# Patient Record
Sex: Male | Born: 2000 | Race: White | Hispanic: No | Marital: Single | Smoking: Never smoker
Health system: Southern US, Community
[De-identification: ages and names within clinical notes are randomized; demographics above are authoritative.]

## PROBLEM LIST (undated history)

## (undated) ENCOUNTER — Emergency Department (HOSPITAL_COMMUNITY): Disposition: A | Discharge status: Home / Self Care | Payer: Medicaid Other

## (undated) DIAGNOSIS — Z8659 Personal history of other mental and behavioral disorders: Secondary | ICD-10-CM

## (undated) DIAGNOSIS — T7840XA Allergy, unspecified, initial encounter: Secondary | ICD-10-CM

## (undated) DIAGNOSIS — Z8669 Personal history of other diseases of the nervous system and sense organs: Secondary | ICD-10-CM

## (undated) HISTORY — DX: Allergy, unspecified, initial encounter: T78.40XA

## (undated) HISTORY — DX: Personal history of other diseases of the nervous system and sense organs: Z86.69

## (undated) HISTORY — PX: CIRCUMCISION: SUR203

## (undated) HISTORY — DX: Personal history of other mental and behavioral disorders: Z86.59

---
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
## 2020-02-24 ENCOUNTER — Other Ambulatory Visit: Payer: Self-pay

## 2020-02-24 ENCOUNTER — Other Ambulatory Visit: Payer: Medicaid Other

## 2020-02-24 DIAGNOSIS — Z20822 Contact with and (suspected) exposure to covid-19: Secondary | ICD-10-CM | POA: Diagnosis not present

## 2020-02-26 LAB — NOVEL CORONAVIRUS, NAA: SARS-CoV-2, NAA: DETECTED — AB

## 2020-02-26 LAB — SPECIMEN STATUS REPORT

## 2020-02-26 LAB — SARS-COV-2, NAA 2 DAY TAT

## 2020-02-28 ENCOUNTER — Other Ambulatory Visit: Payer: Medicaid Other

## 2020-03-01 ENCOUNTER — Ambulatory Visit: Payer: Self-pay | Admitting: *Deleted

## 2020-03-01 NOTE — Telephone Encounter (Signed)
  I returned pt's call.   He was diagnosed with COVID-19  Last Monday.   "All my symptoms have gone away except this cough".   He was coughing a lot while I was talking with him.   It's a dry cough.  No other symptoms. He has tried 3 different over the counter medications but "none of them help".  I let him know it sounded like he needs a prescription cough medicine.  I asked him if he had a primary care physician he replied,   "I don't know".   "My mother takes care of that and makes those calls for me".   "She's sleeping now is why I called you".    "When she wakes up I'll have her call and see if she can get me an appt".  He thanked me for my help.   Reason for Disposition . [1] Continuous (nonstop) coughing interferes with work or school AND [2] no improvement using cough treatment per protocol  Answer Assessment - Initial Assessment Questions 1. ONSET: "When did the cough begin?"      Since COVID on Monday.    2. SEVERITY: "How bad is the cough today?"      I'm coughing a lot. 3. SPUTUM: "Describe the color of your sputum" (none, dry cough; clear, white, yellow, green)     Dry cough 4. HEMOPTYSIS: "Are you coughing up any blood?" If so ask: "How much?" (flecks, streaks, tablespoons, etc.)     No 5. DIFFICULTY BREATHING: "Are you having difficulty breathing?" If Yes, ask: "How bad is it?" (e.g., mild, moderate, severe)    - MILD: No SOB at rest, mild SOB with walking, speaks normally in sentences, can lay down, no retractions, pulse < 100.    - MODERATE: SOB at rest, SOB with minimal exertion and prefers to sit, cannot lie down flat, speaks in phrases, mild retractions, audible wheezing, pulse 100-120.    - SEVERE: Very SOB at rest, speaks in single words, struggling to breathe, sitting hunched forward, retractions, pulse > 120      No 6. FEVER: "Do you have a fever?" If Yes, ask: "What is your temperature, how was it measured, and when did it start?"     No 7. CARDIAC HISTORY: "Do  you have any history of heart disease?" (e.g., heart attack, congestive heart failure)      *No Answer* 8. LUNG HISTORY: "Do you have any history of lung disease?"  (e.g., pulmonary embolus, asthma, emphysema)     *No Answer* 9. PE RISK FACTORS: "Do you have a history of blood clots?" (or: recent major surgery, recent prolonged travel, bedridden)     *No Answer* 10. OTHER SYMPTOMS: "Do you have any other symptoms?" (e.g., runny nose, wheezing, chest pain)       No.  "the symptoms I had initially when I was diagnosed have all gone away except this cough" 11. PREGNANCY: "Is there any chance you are pregnant?" "When was your last menstrual period?"       N/A 12. TRAVEL: "Have you traveled out of the country in the last month?" (e.g., travel history, exposures)       Positive COVID-19  Protocols used: COUGH - ACUTE NON-PRODUCTIVE-A-AH

## 2020-07-21 IMAGING — DX DG HAND COMPLETE 3+V*R*
3 series · 3 of 3 positions shown · non-contrast
Comparison: None.

CLINICAL DATA: Punched a wall 3 days ago with persistent hand pain
medially, initial encounter

EXAM:
RIGHT HAND - COMPLETE 3+ VIEW

[hand pa]
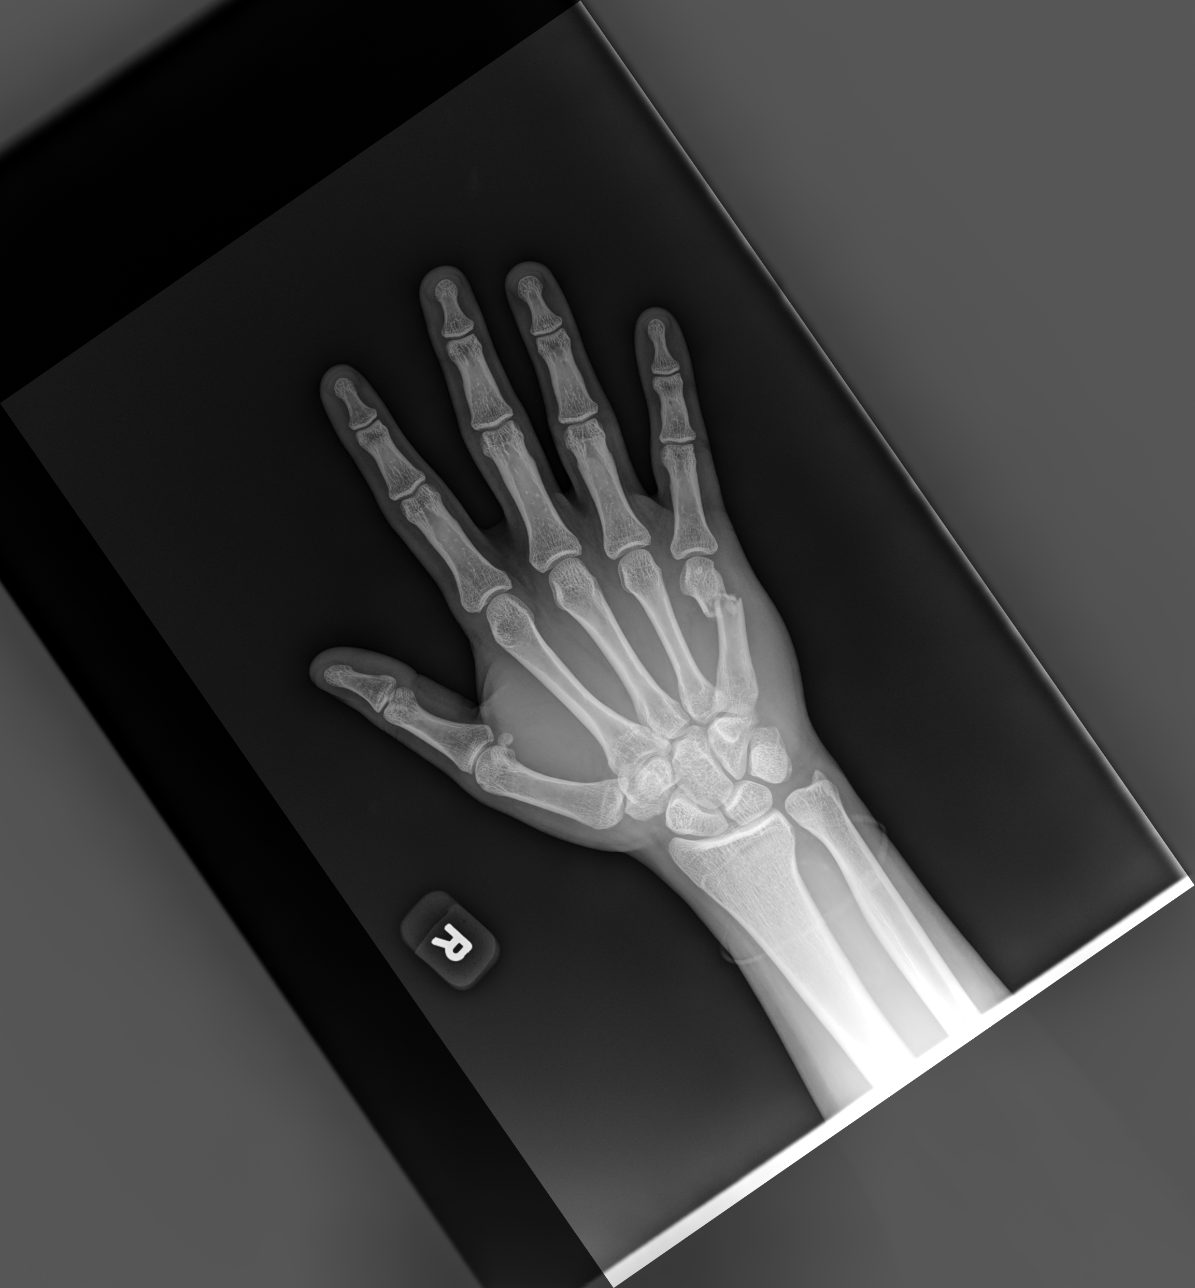

[hand mlo]
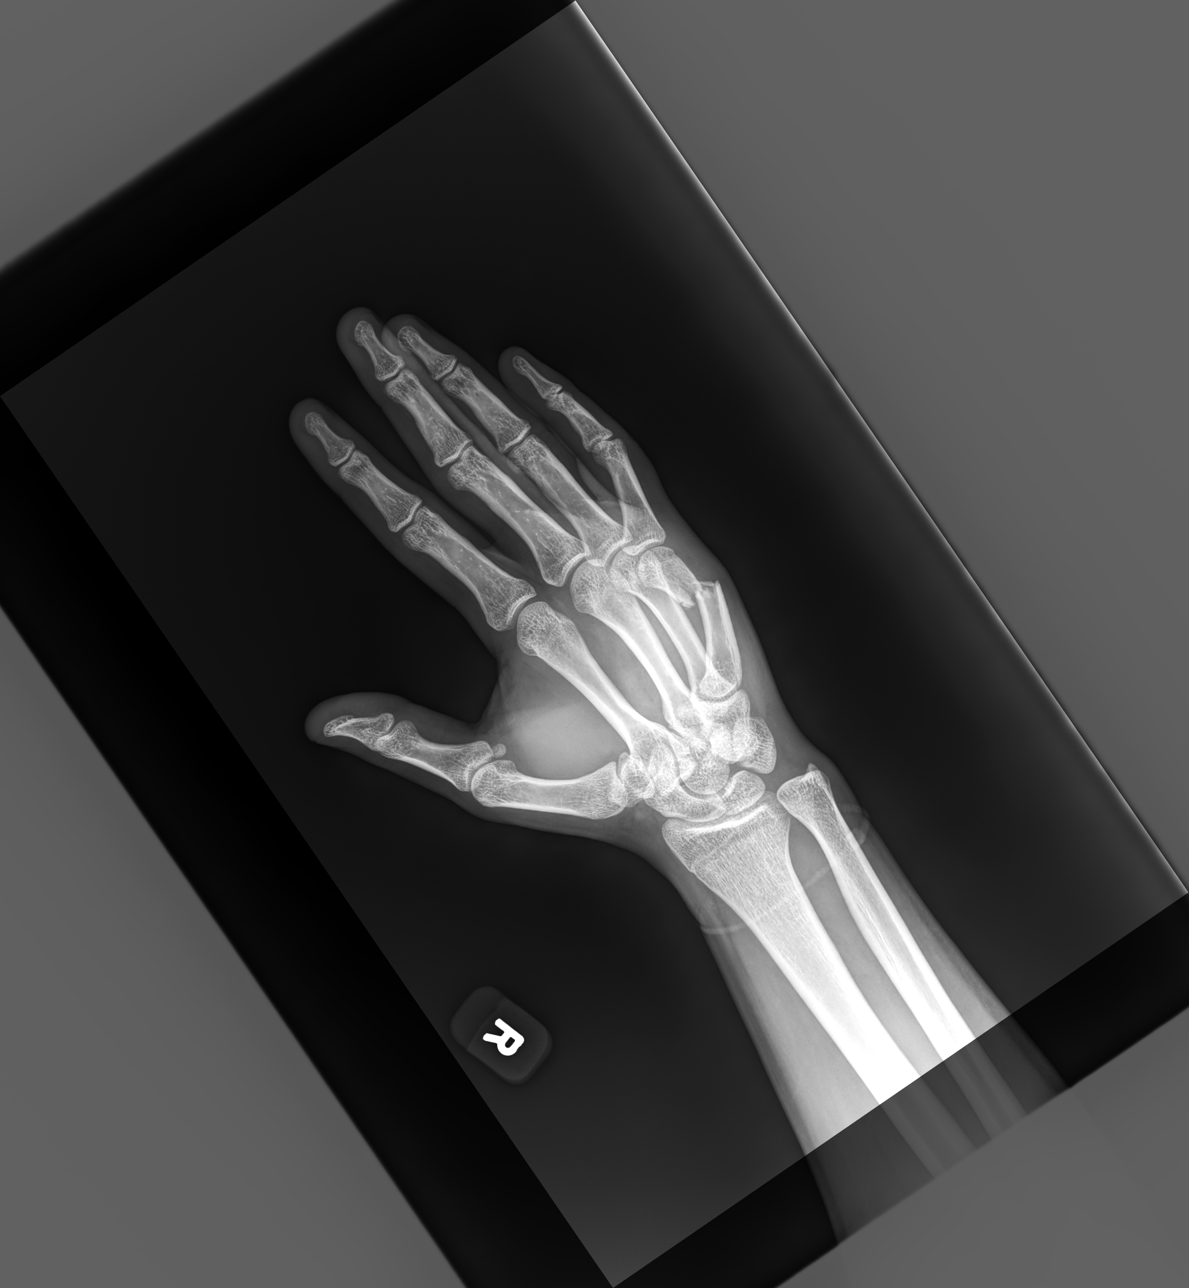

[hand lat]
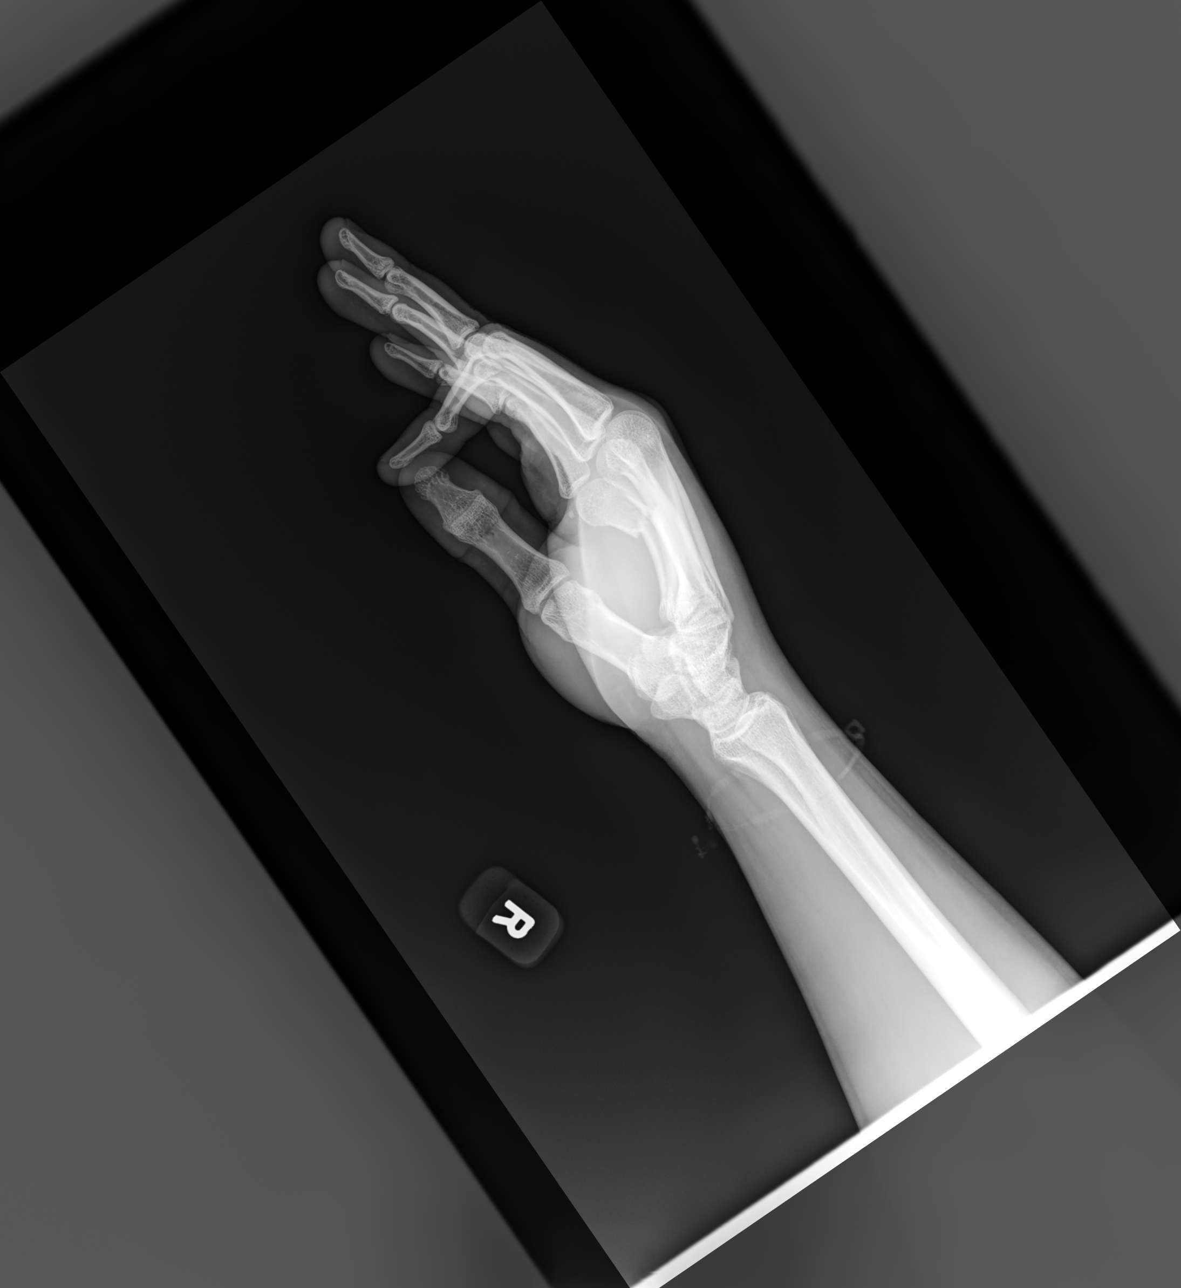

[3 of 3 positions shown; findings below may reference images not displayed]

FINDINGS: Comminuted fifth metacarpal fracture is noted with impaction and [DATE]
bone width displacement and angulation at the fracture site. No
other fracture is seen. Soft tissue swelling is noted.
IMPRESSION: Comminuted fifth metacarpal fracture as described.

## 2020-10-04 ENCOUNTER — Encounter: Payer: Self-pay | Admitting: Pediatrics

## 2022-10-23 DIAGNOSIS — J208 Acute bronchitis due to other specified organisms: Secondary | ICD-10-CM | POA: Diagnosis not present

## 2022-10-23 DIAGNOSIS — R042 Hemoptysis: Secondary | ICD-10-CM | POA: Diagnosis not present

## 2022-10-23 DIAGNOSIS — Z8709 Personal history of other diseases of the respiratory system: Secondary | ICD-10-CM | POA: Diagnosis not present

## 2022-10-23 DIAGNOSIS — B9689 Other specified bacterial agents as the cause of diseases classified elsewhere: Secondary | ICD-10-CM | POA: Diagnosis not present

## 2023-09-04 DIAGNOSIS — M25512 Pain in left shoulder: Secondary | ICD-10-CM | POA: Diagnosis not present

## 2024-04-19 ENCOUNTER — Telehealth: Admitting: Family Medicine

## 2024-04-19 DIAGNOSIS — B9689 Other specified bacterial agents as the cause of diseases classified elsewhere: Secondary | ICD-10-CM | POA: Diagnosis not present

## 2024-04-19 DIAGNOSIS — J019 Acute sinusitis, unspecified: Secondary | ICD-10-CM | POA: Diagnosis not present

## 2024-04-19 MED ORDER — DOXYCYCLINE HYCLATE 100 MG PO TABS: 100.0000 mg | ORAL_TABLET | Freq: Two times a day (BID) | ORAL | 0 refills | Status: AC

## 2024-04-19 NOTE — Progress Notes (Signed)
 E-Visit for Sinus Problems  We are sorry that you are not feeling well.  Here is how we plan to help!  Based on what you have shared with me it looks like you have sinusitis.  Sinusitis is inflammation and infection in the sinus cavities of the head.  Based on your presentation I believe you most likely have Acute Bacterial Sinusitis.  This is an infection caused by bacteria and is treated with antibiotics. I have prescribed Doxycycline  100mg  by mouth twice a day for 7 days. You may use an oral decongestant such as Mucinex  D or if you have glaucoma or high blood pressure use plain Mucinex . Saline nasal spray help and can safely be used as often as needed for congestion.  If you develop worsening sinus pain, fever or notice severe headache and vision changes, or if symptoms are not better after completion of antibiotic, please schedule an appointment with a health care provider.    Sinus infections are not as easily transmitted as other respiratory infection, however we still recommend that you avoid close contact with loved ones, especially the very young and elderly.  Remember to wash your hands thoroughly throughout the day as this is the number one way to prevent the spread of infection!  Home Care: Only take medications as instructed by your medical team. Complete the entire course of an antibiotic. Do not take these medications with alcohol. A steam or ultrasonic humidifier can help congestion.  You can place a towel over your head and breathe in the steam from hot water  coming from a faucet. Avoid close contacts especially the very young and the elderly. Cover your mouth when you cough or sneeze. Always remember to wash your hands.  Get Help Right Away If: You develop worsening fever or sinus pain. You develop a severe head ache or visual changes. Your symptoms persist after you have completed your treatment plan.  Make sure you Understand these instructions. Will watch your  condition. Will get help right away if you are not doing well or get worse.  Your e-visit answers were reviewed by a board certified advanced clinical practitioner to complete your personal care plan.  Depending on the condition, your plan could have included both over the counter or prescription medications.  If there is a problem please reply  once you have received a response from your provider.  Your safety is important to us .  If you have drug allergies check your prescription carefully.    You can use MyChart to ask questions about today's visit, request a non-urgent call back, or ask for a work or school excuse for 24 hours related to this e-Visit. If it has been greater than 24 hours you will need to follow up with your provider, or enter a new e-Visit to address those concerns.  You will get an e-mail in the next two days asking about your experience.  I hope that your e-visit has been valuable and will speed your recovery. Thank you for using e-visits.  I have spent 5 minutes in review of e-visit questionnaire, review and updating patient chart, medical decision making and response to patient.   Shenna Brissette, FNP
